# Patient Record
Sex: Male | Born: 2000 | Hispanic: No | Marital: Single
Health system: Southern US, Community
[De-identification: ages and names within clinical notes are randomized; demographics above are authoritative.]

---

## 2019-07-21 ENCOUNTER — Emergency Department (HOSPITAL_COMMUNITY)
Admission: EM | Admit: 2019-07-21 | Discharge: 2019-07-22 | Disposition: A | Discharge status: Home / Self Care | Payer: BC Managed Care – PPO | Attending: Emergency Medicine | Admitting: Emergency Medicine

## 2019-07-21 ENCOUNTER — Other Ambulatory Visit: Payer: Self-pay

## 2019-07-21 ENCOUNTER — Emergency Department (HOSPITAL_COMMUNITY): Payer: BC Managed Care – PPO

## 2019-07-21 DIAGNOSIS — R040 Epistaxis: Secondary | ICD-10-CM | POA: Diagnosis not present

## 2019-07-21 DIAGNOSIS — R51 Headache: Secondary | ICD-10-CM | POA: Insufficient documentation

## 2019-07-21 DIAGNOSIS — R220 Localized swelling, mass and lump, head: Secondary | ICD-10-CM | POA: Diagnosis not present

## 2019-07-21 DIAGNOSIS — S0992XA Unspecified injury of nose, initial encounter: Secondary | ICD-10-CM

## 2019-07-21 NOTE — ED Triage Notes (Signed)
Per pt he was playing basket ball and was hit in the nose by an elbow or shoulder. When observing his nose doer shave some swelling and deviation to the left.

## 2019-07-22 NOTE — Discharge Instructions (Addendum)
Thank you for allowing me to care for you today in the Emergency Department.   You can take 600 mg of ibuprofen with food every 6 hours for pain.  You can apply an ice pack to your nose for 15 to 20 minutes as frequently as needed to help with pain and swelling.  If you develop a nosebleed, lean forward and apply pressure to both of your nostrils for at least 10 minutes.  Afrin is also available over-the-counter.  If you were to develop a nosebleed that did not stop by just applying pressure, you could insert 1 squirt of Afrin into the side of the nose that is bleeding and then lean forward and continue to apply pressure for another 10 minutes.  This should stop most nosebleeds.  If the swelling goes down to your nose over the next week and you are continuing to have significant pain, if you are displeased with the appearance of the nose, or other concerns regarding your nose, you can follow-up with ear nose and throat.  Dr. Noreene Filbert information is listed above.  Return to the emergency department if you develop a nosebleed that you are unable to stop at home, changes to your vision, dizziness or lightheadedness, if you were to pass out, or other new, concerning symptoms.

## 2019-07-22 NOTE — ED Provider Notes (Signed)
[facility] EMERGENCY DEPARTMENT Provider Note   CSN: [id number] Arrival date & time: 07/21/19  2104    History   Chief Complaint Chief Complaint  Patient presents with   Facial Swelling    hit in face    HPI Talis Iwan is a 18 y.o. male with no pertinent past medical history who reports to the emergency department with a chief complaint of nose injury.  The patient reports he was playing basketball earlier tonight and was hit in the nose by another player with either their elbow or shoulder.  He denies of falling, nausea, vomiting, headache, visual changes.  He is endorsing pain and swelling to the bridge of the nose.  Pain is constant.  No other facial pain.  No pain with moving his eyes.  No history of nasal bone fracture.  No treatment prior to arrival.  He reports that after the injury that he developed a nosebleed, which has since resolved.  He reports that he initially felt a little dizzy while his nose was bleeding, but reports that this has also resolved.  No lightheadedness or near syncope.     The history is provided by the patient. No language interpreter was used.    No past medical history on file.  There are no active problems to display for this patient.     Home Medications    Prior to Admission medications   Not on File    Family History No family history on file.  Social History Social History   Tobacco Use   Smoking status: Not on file  Substance Use Topics   Alcohol use: Not on file   Drug use: Not on file     Allergies   Patient has no allergy information on record.   Review of Systems Review of Systems  Constitutional: Negative for activity change.  HENT: Positive for facial swelling.   Eyes: Negative for photophobia and visual disturbance.  Respiratory: Negative for shortness of breath.   Cardiovascular: Negative for chest pain.  Gastrointestinal: Negative for abdominal pain, nausea and vomiting.    Musculoskeletal: Negative for back pain.  Skin: Negative for rash.  Neurological: Negative for dizziness, weakness, light-headedness, numbness and headaches.     Physical Exam Updated Vital Signs BP 122/71 (BP Location: Right Arm)    Pulse 87    Temp 98.3 F (36.8 C) (Oral)    Resp 17    SpO2 95%   Physical Exam Vitals signs and nursing note reviewed.  Constitutional:      Appearance: He is well-developed.  HENT:     Head: Normocephalic.     Nose: Signs of injury present. No septal deviation, congestion or rhinorrhea.     Right Nostril: No septal hematoma or occlusion.     Left Nostril: No septal hematoma or occlusion.     Right Sinus: No maxillary sinus tenderness or frontal sinus tenderness.     Left Sinus: No maxillary sinus tenderness or frontal sinus tenderness.     Comments: Swelling noted to the right cartilage over the bridge of the nose.  No obvious step-offs.  No septal hematoma.  No epistaxis.  No tenderness to palpation to the bilateral cheeks, periorbital regions, forehead, or over the maxillary or mandible. Eyes:     Conjunctiva/sclera: Conjunctivae normal.  Neck:     Musculoskeletal: Neck supple.  Cardiovascular:     Rate and Rhythm: Normal rate and regular rhythm.     Heart sounds: No murmur.  Pulmonary:     Effort: Pulmonary effort is normal.  Abdominal:     General: There is no distension.     Palpations: Abdomen is soft.  Skin:    General: Skin is warm and dry.  Neurological:     Mental Status: He is alert.  Psychiatric:        Behavior: Behavior normal.      ED Treatments / Results  Labs (all labs ordered are listed, but only abnormal results are displayed) Labs Reviewed - No data to display  EKG None  Radiology Dg Nasal Bones  Result Date: 07/21/2019 CLINICAL DATA:  18 year old male with trauma to the nose. EXAM: NASAL BONES - 3+ VIEW COMPARISON:  None. FINDINGS: There is no evidence of fracture or other bone abnormality. IMPRESSION:  Negative. Electronically Signed   By: Elgie CollardArash  Radparvar M.D.   On: 07/21/2019 22:28    Procedures Procedures (including critical care time)  Medications Ordered in ED Medications - No data to display   Initial Impression / Assessment and Plan / ED Course  I have reviewed the triage vital signs and the nursing notes.  Pertinent labs & imaging results that were available during my care of the patient were reviewed by me and considered in my medical decision making (see chart for details).        18 year old male with no pertinent past medical history presenting with a nose injury that occurred after he was hit in the nose while playing basketball earlier tonight.  He is hemodynamically stable.  He initially had an episode of epistaxis, which is since resolved.  On exam, there is swelling to the bridge of the nose and over the right cartilage.  No septal hematoma.  No epistaxis.  He has no other facial tenderness.  No other reported injuries.  X-ray of the nasal bones was ordered by triage, which is negative.  Shared decision making conversation regarding CT maxillofacial versus observation with referral to ENT.  Given that the treatment course would be the same if he had a nasal bone fracture, the patient opted to decline CT at this time.  He will use ibuprofen and Tylenol and ice to help with pain and swelling.  If he has continued pain or after swelling resolves and there is disfigurement to the nose, he will follow-up with ENT.  He was advised not to blow his nose.  He is hemodynamically stable and in no acute distress.  Safe for discharge home with outpatient follow-up this time.  Final Clinical Impressions(s) / ED Diagnoses   Final diagnoses:  Injury of nose, initial encounter    ED Discharge Orders    None       Magdelyn Roebuck A, PA-C 07/22/19 0148    Ward, Layla MawKristen N, DO 07/22/19 [id number]

## 2020-12-10 IMAGING — DX NASAL BONES - 3+ VIEW
3 series · 3 of 3 positions shown · non-contrast
Comparison: None.

CLINICAL DATA: 18-year-old male with trauma to the nose.

EXAM:
NASAL BONES - 3+ VIEW

[nasal waters]
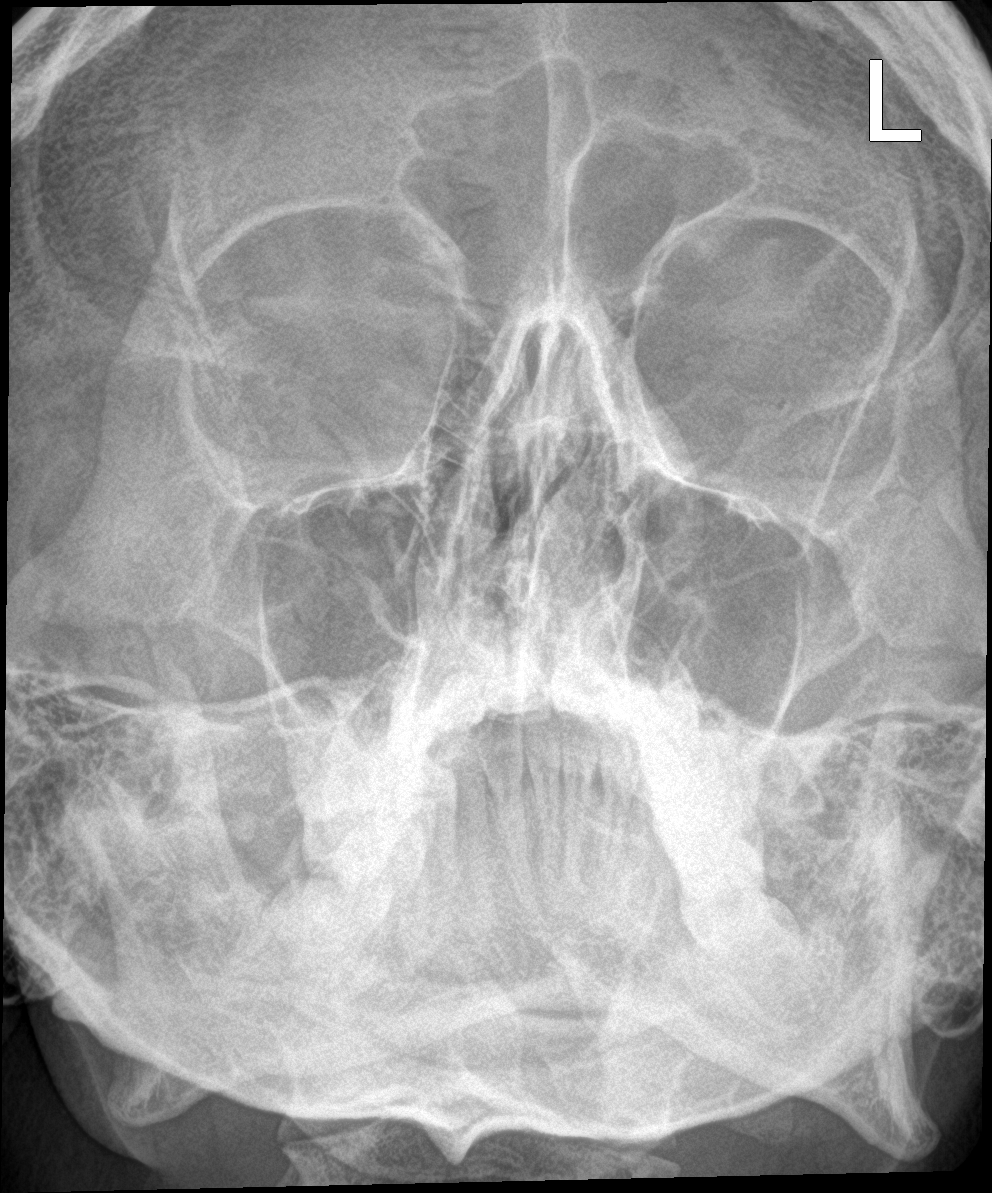

[nasal lat (1 of 2)]
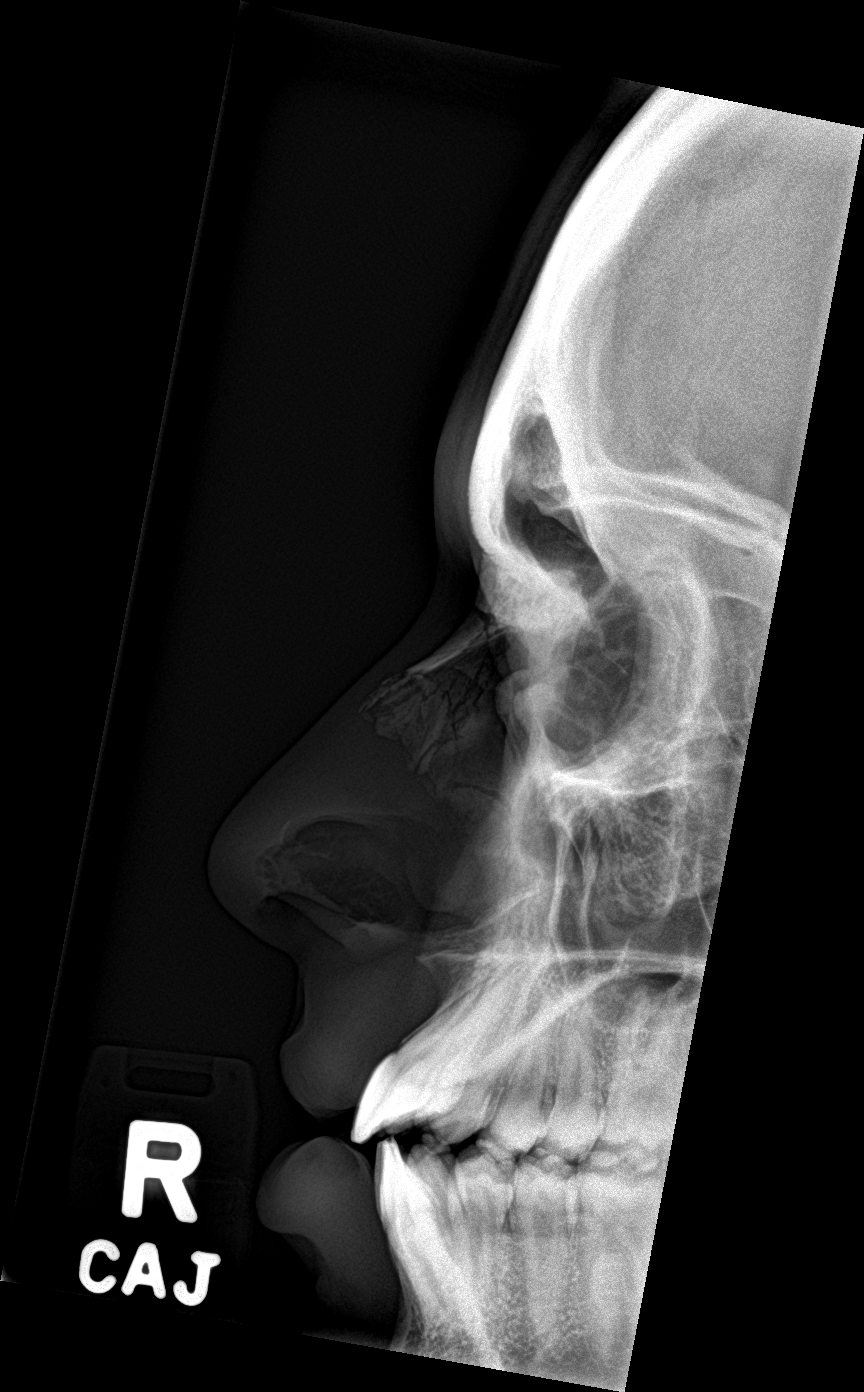

[nasal lat (2 of 2)]
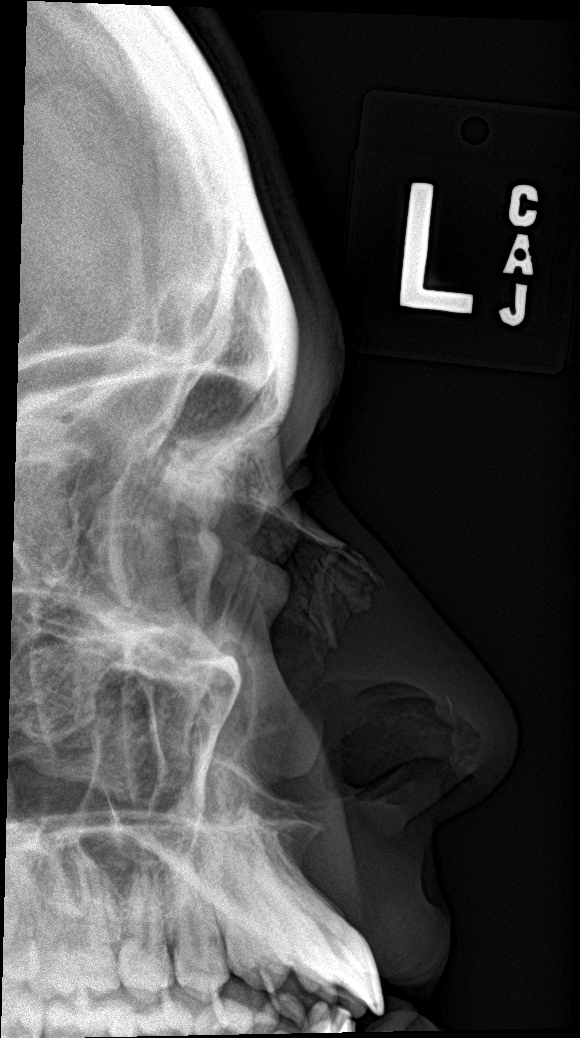

[3 of 3 positions shown; findings below may reference images not displayed]

FINDINGS: There is no evidence of fracture or other bone abnormality.
IMPRESSION: Negative.
